# Patient Record
Sex: Male | Born: 1980 | Race: Black or African American | Hispanic: No | Marital: Single | Smoking: Current every day smoker
Health system: Southern US, Community
[De-identification: ages and names within clinical notes are randomized; demographics above are authoritative.]

## PROBLEM LIST (undated history)

## (undated) HISTORY — PX: EXPLORATORY LAPAROTOMY: SUR591

---

## 2010-03-02 ENCOUNTER — Emergency Department (HOSPITAL_BASED_OUTPATIENT_CLINIC_OR_DEPARTMENT_OTHER): Admission: EM | Admit: 2010-03-02 | Discharge: 2010-03-02 | Payer: Self-pay | Admitting: Emergency Medicine

## 2011-07-07 ENCOUNTER — Encounter: Payer: Self-pay | Admitting: *Deleted

## 2011-07-07 ENCOUNTER — Emergency Department (HOSPITAL_BASED_OUTPATIENT_CLINIC_OR_DEPARTMENT_OTHER)
Admission: EM | Admit: 2011-07-07 | Discharge: 2011-07-07 | Disposition: A | Discharge status: Home / Self Care | Payer: Self-pay | Attending: Emergency Medicine | Admitting: Emergency Medicine

## 2011-07-07 ENCOUNTER — Emergency Department (HOSPITAL_BASED_OUTPATIENT_CLINIC_OR_DEPARTMENT_OTHER): Payer: Self-pay

## 2011-07-07 ENCOUNTER — Emergency Department (INDEPENDENT_AMBULATORY_CARE_PROVIDER_SITE_OTHER): Payer: Self-pay

## 2011-07-07 DIAGNOSIS — S51809A Unspecified open wound of unspecified forearm, initial encounter: Secondary | ICD-10-CM

## 2011-07-07 DIAGNOSIS — S41119A Laceration without foreign body of unspecified upper arm, initial encounter: Secondary | ICD-10-CM

## 2011-07-07 DIAGNOSIS — W269XXA Contact with unspecified sharp object(s), initial encounter: Secondary | ICD-10-CM

## 2011-07-07 DIAGNOSIS — F17200 Nicotine dependence, unspecified, uncomplicated: Secondary | ICD-10-CM | POA: Insufficient documentation

## 2011-07-07 DIAGNOSIS — W268XXA Contact with other sharp object(s), not elsewhere classified, initial encounter: Secondary | ICD-10-CM | POA: Insufficient documentation

## 2011-07-07 MED ORDER — LIDOCAINE-EPINEPHRINE 2 %-1:100000 IJ SOLN
INTRAMUSCULAR | Status: AC
  Administered 2011-07-07: 15:00:00
  Filled 2011-07-07: qty 1

## 2011-07-07 MED ORDER — TETANUS-DIPHTHERIA TOXOIDS TD 5-2 LFU IM INJ: 0.5000 mL | INJECTION | Freq: Once | INTRAMUSCULAR | Status: DC

## 2011-07-07 MED ORDER — LIDOCAINE HCL (PF) 1 % IJ SOLN: 5.0000 mL | Freq: Once | INTRAMUSCULAR | Status: DC

## 2011-07-07 MED ORDER — TETANUS-DIPHTH-ACELL PERTUSSIS 5-2.5-18.5 LF-MCG/0.5 IM SUSP
0.5000 mL | Freq: Once | INTRAMUSCULAR | Status: AC
  Administered 2011-07-07: 0.5 mL via INTRAMUSCULAR
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
## 2011-07-20 ENCOUNTER — Encounter (HOSPITAL_BASED_OUTPATIENT_CLINIC_OR_DEPARTMENT_OTHER): Payer: Self-pay | Admitting: *Deleted

## 2011-07-20 ENCOUNTER — Emergency Department (HOSPITAL_BASED_OUTPATIENT_CLINIC_OR_DEPARTMENT_OTHER)
Admission: EM | Admit: 2011-07-20 | Discharge: 2011-07-20 | Disposition: A | Discharge status: Home / Self Care | Payer: Self-pay | Attending: Emergency Medicine | Admitting: Emergency Medicine

## 2011-07-20 DIAGNOSIS — F17200 Nicotine dependence, unspecified, uncomplicated: Secondary | ICD-10-CM | POA: Insufficient documentation

## 2011-07-20 DIAGNOSIS — Z4802 Encounter for removal of sutures: Secondary | ICD-10-CM | POA: Insufficient documentation

## 2011-07-20 NOTE — ED Provider Notes (Signed)
History     CSN: [id number] Arrival date & time: 07/20/2011 10:57 PM  Chief Complaint  Patient presents with  . Suture / Staple Removal   Patient is a 30 y.o. male presenting with suture removal. The history is provided by the patient.  Suture / Staple Removal  The sutures were placed 11 to 14 days ago. There has been no treatment since the wound repair. There has been no drainage from the wound. There is no redness present. There is no swelling present. The pain has no pain.    History reviewed. No pertinent past medical history.  Past Surgical History  Procedure Date  . Exploratory laparotomy     History reviewed. No pertinent family history.  History  Substance Use Topics  . Smoking status: Current Everyday Smoker -- 0.5 packs/day  . Smokeless tobacco: Not on file  . Alcohol Use: Yes      Review of Systems  All other systems reviewed and are negative.    Physical Exam  BP 125/70  Pulse 63  Temp(Src) 98.2 F (36.8 C) (Oral)  Resp 18  SpO2 96%  Physical Exam  Nursing note and vitals reviewed. Constitutional: He is oriented to person, place, and time. He appears well-developed and well-nourished.  Non-toxic appearance.  HENT:  Head: Normocephalic and atraumatic.  Eyes: Conjunctivae are normal. Pupils are equal, round, and reactive to light.  Neck: Normal range of motion.  Cardiovascular: Normal rate.   Pulmonary/Chest: Effort normal.  Musculoskeletal:       Right shoulder: He exhibits laceration. He exhibits no tenderness and no pain.       Arms: Neurological: He is alert and oriented to person, place, and time.  Skin: Skin is warm and dry.  Psychiatric: He has a normal mood and affect.    ED Course  Procedures  MDM   Laceration healed, nurse removed sutres      Toy Baker, MD 07/20/11 2334

## 2011-07-20 NOTE — ED Notes (Signed)
Pt has 4 sutures to right forearm.  2 have already started to separate and 2 are still intact with some swelling and pus noted to entry site.  Pt was not given abx at time of suture

## 2011-07-20 NOTE — ED Notes (Addendum)
Pt presents to ED today for suture removal from lac to right forearm.  Initial placement 07/07/11

## 2012-02-02 ENCOUNTER — Emergency Department (HOSPITAL_BASED_OUTPATIENT_CLINIC_OR_DEPARTMENT_OTHER)
Admission: EM | Admit: 2012-02-02 | Discharge: 2012-02-02 | Disposition: A | Discharge status: Home / Self Care | Payer: Self-pay | Attending: Emergency Medicine | Admitting: Emergency Medicine

## 2012-02-02 ENCOUNTER — Encounter (HOSPITAL_BASED_OUTPATIENT_CLINIC_OR_DEPARTMENT_OTHER): Payer: Self-pay | Admitting: *Deleted

## 2012-02-02 DIAGNOSIS — L089 Local infection of the skin and subcutaneous tissue, unspecified: Secondary | ICD-10-CM | POA: Insufficient documentation

## 2012-02-02 MED ORDER — HYDROCODONE-ACETAMINOPHEN 5-325 MG PO TABS: 2.0000 | ORAL_TABLET | ORAL | Status: AC | PRN

## 2012-02-02 MED ORDER — CEFTRIAXONE SODIUM 1 G IJ SOLR
1.0000 g | Freq: Once | INTRAMUSCULAR | Status: AC
  Administered 2012-02-02: 1 g via INTRAMUSCULAR
  Filled 2012-02-02: qty 10

## 2012-02-02 MED ORDER — TETANUS-DIPHTH-ACELL PERTUSSIS 5-2.5-18.5 LF-MCG/0.5 IM SUSP
0.5000 mL | Freq: Once | INTRAMUSCULAR | Status: AC
  Administered 2012-02-02: 0.5 mL via INTRAMUSCULAR
  Filled 2012-02-02: qty 0.5

## 2012-02-02 MED ORDER — CLINDAMYCIN HCL 150 MG PO CAPS: 150.0000 mg | ORAL_CAPSULE | Freq: Four times a day (QID) | ORAL | Status: AC
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
## 2012-02-02 NOTE — Discharge Instructions (Signed)
Skin Infections A skin infection usually develops as a result of disruption of the skin barrier.  CAUSES  A skin infection might occur following:  Trauma or an injury to the skin such as a cut or insect sting.   Inflammation (as in eczema).   Breaks in the skin between the toes (as in athlete's foot).   Swelling (edema).  SYMPTOMS  The legs are the most common site affected. Usually there is:  Redness.   Swelling.   Pain.   There may be red streaks in the area of the infection.  TREATMENT   Minor skin infections may be treated with topical antibiotics, but if the skin infection is severe, hospital care and intravenous (IV) antibiotic treatment may be needed.   Most often skin infections can be treated with oral antibiotic medicine as well as proper rest and elevation of the affected area until the infection improves.   If you are prescribed oral antibiotics, it is important to take them as directed and to take all the pills even if you feel better before you have finished all of the medicine.   You may apply warm compresses to the area for 20-30 minutes 4 times daily.  You might need a tetanus shot now if:  You have no idea when you had the last one.   You have never had a tetanus shot before.   Your wound had dirt in it.  If you need a tetanus shot and you decide not to get one, there is a rare chance of getting tetanus. Sickness from tetanus can be serious. If you get a tetanus shot, your arm may swell and become red and warm at the shot site. This is common and not a problem. SEEK MEDICAL CARE IF:  The pain and swelling from your infection do not improve within 2 days.  SEEK IMMEDIATE MEDICAL CARE IF:  You develop a fever, chills, or other serious problems.  Document Released: 12/28/2004 Document Revised: 08/02/2011 Document Reviewed: 11/09/2008 [employer] Patient Information 2012 [employer]. 
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
## 2012-02-02 NOTE — ED Provider Notes (Signed)
Medical screening examination/treatment/procedure(s) were performed by non-physician practitioner and as supervising physician I was immediately available for consultation/collaboration.   Cyndra Numbers, MD 02/02/12 [phone]
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
## 2012-07-04 ENCOUNTER — Emergency Department (HOSPITAL_BASED_OUTPATIENT_CLINIC_OR_DEPARTMENT_OTHER)
Admission: EM | Admit: 2012-07-04 | Discharge: 2012-07-04 | Disposition: A | Discharge status: Home / Self Care | Payer: Self-pay | Attending: Emergency Medicine | Admitting: Emergency Medicine

## 2012-07-04 DIAGNOSIS — B354 Tinea corporis: Secondary | ICD-10-CM | POA: Insufficient documentation

## 2012-07-04 DIAGNOSIS — F17200 Nicotine dependence, unspecified, uncomplicated: Secondary | ICD-10-CM | POA: Insufficient documentation

## 2012-07-04 DIAGNOSIS — Z880 Allergy status to penicillin: Secondary | ICD-10-CM | POA: Insufficient documentation

## 2012-07-04 MED ORDER — KETOCONAZOLE 200 MG PO TABS: 200.0000 mg | ORAL_TABLET | Freq: Every day | ORAL | Status: DC

## 2012-07-04 MED ORDER — FLUCONAZOLE 200 MG PO TABS: ORAL_TABLET | ORAL | Status: DC

## 2012-07-04 NOTE — ED Provider Notes (Signed)
Medical screening examination/treatment/procedure(s) were performed by non-physician practitioner and as supervising physician I was immediately available for consultation/collaboration.   Glynn Octave, MD 07/04/12 [phone]

## 2012-07-04 NOTE — ED Notes (Signed)
Pt. Reports he takes all his clothes to dry cleaners except under clothes so he hasnt used any new detergent.  Pt. Reports he always switches up soap to bathe with.  Pt. Has noted spot on his forehead and on his head.

## 2012-07-04 NOTE — ED Provider Notes (Signed)
History     CSN: [id number]  Arrival date & time 07/04/12  1205   First MD Initiated Contact with Patient 07/04/12 1224      Chief Complaint  Patient presents with  . Rash    Pt. noticed 3-4 wks ago he started seeing round spots on arms and back raised with no drainage and no itching per Pt.    (Consider location/radiation/quality/duration/timing/severity/associated sxs/prior treatment) Patient is a 31 y.o. male presenting with rash. The history is provided by the patient. No language interpreter was used.  Rash  This is a new problem. The problem is associated with nothing. There has been no fever. The rash is present on the scalp, back and torso. The pain is at a severity of 0/10. The patient is experiencing no pain. The pain has been constant since onset. He has tried nothing for the symptoms.  Pt has large bald spot, lesion on forehead anarms  No past medical history on file.  Past Surgical History  Procedure Date  . Exploratory laparotomy     No family history on file.  History  Substance Use Topics  . Smoking status: Current Everyday Smoker -- 0.5 packs/day  . Smokeless tobacco: Not on file  . Alcohol Use: Yes      Review of Systems  Skin: Positive for rash.  All other systems reviewed and are negative.    Allergies  Amoxicillin  Home Medications   Current Outpatient Rx  Name Route Sig Dispense Refill  . KETOCONAZOLE 200 MG PO TABS Oral Take 1 tablet (200 mg total) by mouth daily. 28 tablet 0    BP 137/96  Pulse 71  Temp 98 F (36.7 C) (Oral)  Resp 16  Ht 6' (1.829 m)  Wt 250 lb (113.399 kg)  BMI 33.91 kg/m2  SpO2 100%  Physical Exam  Nursing note and vitals reviewed. Constitutional: He is oriented to person, place, and time. He appears well-developed and well-nourished.  HENT:  Head: Normocephalic and atraumatic.  Right Ear: External ear normal.  Left Ear: External ear normal.  Nose: Nose normal.  Mouth/Throat: Oropharynx is clear and  moist.  Eyes: Conjunctivae and EOM are normal. Pupils are equal, round, and reactive to light.  Cardiovascular: Normal rate.   Pulmonary/Chest: Effort normal.  Musculoskeletal: Normal range of motion.  Neurological: He is alert and oriented to person, place, and time. He has normal reflexes.  Skin: Rash noted.       Multiple lesions with skin clearing in center  Psychiatric: He has a normal mood and affect.    ED Course  Procedures (including critical care time)  Labs Reviewed - No data to display No results found.   1. Tinea corporis       MDM  Pt given diflucan x 2 weeks         Lonia Skinner [place], [place] 07/04/12 1259

## 2012-08-14 ENCOUNTER — Emergency Department (HOSPITAL_BASED_OUTPATIENT_CLINIC_OR_DEPARTMENT_OTHER)
Admission: EM | Admit: 2012-08-14 | Discharge: 2012-08-14 | Disposition: A | Discharge status: Home / Self Care | Payer: Self-pay | Attending: Emergency Medicine | Admitting: Emergency Medicine

## 2012-08-14 ENCOUNTER — Encounter (HOSPITAL_BASED_OUTPATIENT_CLINIC_OR_DEPARTMENT_OTHER): Payer: Self-pay | Admitting: *Deleted

## 2012-08-14 DIAGNOSIS — B350 Tinea barbae and tinea capitis: Secondary | ICD-10-CM | POA: Insufficient documentation

## 2012-08-14 DIAGNOSIS — B354 Tinea corporis: Secondary | ICD-10-CM | POA: Insufficient documentation

## 2012-08-14 DIAGNOSIS — N342 Other urethritis: Secondary | ICD-10-CM | POA: Insufficient documentation

## 2012-08-14 MED ORDER — LIDOCAINE HCL (PF) 1 % IJ SOLN
INTRAMUSCULAR | Status: AC
  Administered 2012-08-14: 2 mL
  Filled 2012-08-14: qty 5

## 2012-08-14 MED ORDER — CEFTRIAXONE SODIUM 250 MG IJ SOLR
250.0000 mg | Freq: Once | INTRAMUSCULAR | Status: AC
  Administered 2012-08-14: 250 mg via INTRAMUSCULAR
  Filled 2012-08-14: qty 250

## 2012-08-14 MED ORDER — KETOCONAZOLE 200 MG PO TABS: 200.0000 mg | ORAL_TABLET | Freq: Every day | ORAL | Status: AC

## 2012-08-14 MED ORDER — AZITHROMYCIN 250 MG PO TABS
1000.0000 mg | ORAL_TABLET | Freq: Once | ORAL | Status: AC
  Administered 2012-08-14: 1000 mg via ORAL
  Filled 2012-08-14: qty 4

## 2012-08-14 MED ORDER — KETOCONAZOLE 2 % EX CREA: TOPICAL_CREAM | Freq: Every day | CUTANEOUS | Status: AC

## 2012-08-14 NOTE — ED Notes (Signed)
Pt c/o ? Ring worm to left arm

## 2012-08-14 NOTE — ED Provider Notes (Signed)
History     CSN: [id number]  Arrival date & time 08/14/12  0008   First MD Initiated Contact with Patient 08/14/12 0153      Chief Complaint  Patient presents with  . Rash     HPI Patient presents to the emergency room with complaints of penile discharge.  He states it is a milky type discharge. He denies any fevers or coughing or abdominal pain.  Patient also mentions that he has had a persistent rash. He was seen previously and given a prescription for Diflucan but he did not complete the prescription because of the side effects. Patient states he continues to have a pulse on his scalp and is also noticed a rash on his body. He denies any fevers coughing weight loss or other complaints History reviewed. No pertinent past medical history.  Past Surgical History  Procedure Date  . Exploratory laparotomy     History reviewed. No pertinent family history.  History  Substance Use Topics  . Smoking status: Current Every Day Smoker -- 0.5 packs/day  . Smokeless tobacco: Not on file  . Alcohol Use: Yes      Review of Systems  All other systems reviewed and are negative.    Allergies  Amoxicillin  Home Medications   Current Outpatient Rx  Name Route Sig Dispense Refill  . FLUCONAZOLE 200 MG PO TABS  3 tablets once a day for 2 weeks for tinea 42 tablet 0    BP 125/86  Pulse 72  Temp 97.6 F (36.4 C) (Oral)  Resp 16  Ht 6\' 2"  (1.88 m)  Wt 250 lb (113.399 kg)  BMI 32.10 kg/m2  SpO2 100%  Physical Exam  Nursing note and vitals reviewed. Constitutional: He appears well-developed and well-nourished. No distress.  HENT:  Head: Normocephalic and atraumatic.  Right Ear: External ear normal.  Left Ear: External ear normal.  Eyes: Conjunctivae normal are normal. Right eye exhibits no discharge. Left eye exhibits no discharge. No scleral icterus.  Neck: Neck supple. No tracheal deviation present.  Cardiovascular: Normal rate.   Pulmonary/Chest: Effort normal. No  stridor. No respiratory distress.  Genitourinary: Penis normal.  Musculoskeletal: He exhibits no edema.  Neurological: He is alert. Cranial nerve deficit: no gross deficits.  Skin: Skin is warm and dry. No rash noted.       A few areas of scaling rash in small circular areas on his forearms, circular area on his scalp of alopecia  Psychiatric: He has a normal mood and affect.    ED Course  Procedures (including critical care time)   Labs Reviewed  GC/CHLAMYDIA PROBE AMP, GENITAL   No results found.   1. Urethritis   2. Tinea corporis   3. Tinea capitis       MDM  Patient treated in. They for STD exposure. Regarding his rash, this may be tinea corporis. He does have a spot on his scalp however that could be consistent with tinea capitis. Do not feel there is any evidence to suggest an emergency medical condition. I recommended he follow up with her primary doctor or dermatologist.  Will rx ketoconazole to treat tinea capitis.        Celene Kras, MD 08/14/12 [phone]

## 2012-08-15 LAB — GC/CHLAMYDIA PROBE AMP, GENITAL: Chlamydia, DNA Probe: NEGATIVE

## 2012-08-15 NOTE — ED Notes (Signed)
Patient called wanting lab results. Verified ID x 2. Informed pt positive for Gonorrhea. Treated per protocol. Advised to notify partner(s) for testing and treatment.

## 2012-08-16 NOTE — ED Notes (Signed)
[facility] faxed 

## 2016-06-06 ENCOUNTER — Emergency Department (HOSPITAL_BASED_OUTPATIENT_CLINIC_OR_DEPARTMENT_OTHER)
Admission: EM | Admit: 2016-06-06 | Discharge: 2016-06-06 | Disposition: A | Discharge status: Home / Self Care | Payer: Self-pay | Attending: Emergency Medicine | Admitting: Emergency Medicine

## 2016-06-06 ENCOUNTER — Encounter (HOSPITAL_BASED_OUTPATIENT_CLINIC_OR_DEPARTMENT_OTHER): Payer: Self-pay | Admitting: *Deleted

## 2016-06-06 ENCOUNTER — Emergency Department (HOSPITAL_BASED_OUTPATIENT_CLINIC_OR_DEPARTMENT_OTHER): Payer: Self-pay

## 2016-06-06 DIAGNOSIS — W1802XA Striking against glass with subsequent fall, initial encounter: Secondary | ICD-10-CM | POA: Insufficient documentation

## 2016-06-06 DIAGNOSIS — Y929 Unspecified place or not applicable: Secondary | ICD-10-CM | POA: Insufficient documentation

## 2016-06-06 DIAGNOSIS — Y999 Unspecified external cause status: Secondary | ICD-10-CM | POA: Insufficient documentation

## 2016-06-06 DIAGNOSIS — F17200 Nicotine dependence, unspecified, uncomplicated: Secondary | ICD-10-CM | POA: Insufficient documentation

## 2016-06-06 DIAGNOSIS — S61511A Laceration without foreign body of right wrist, initial encounter: Secondary | ICD-10-CM | POA: Insufficient documentation

## 2016-06-06 DIAGNOSIS — Y939 Activity, unspecified: Secondary | ICD-10-CM | POA: Insufficient documentation

## 2016-06-06 MED ORDER — CEPHALEXIN 250 MG PO CAPS
500.0000 mg | ORAL_CAPSULE | Freq: Once | ORAL | Status: AC
Start: 2016-06-06 — End: 2016-06-06
  Administered 2016-06-06: 500 mg via ORAL
  Filled 2016-06-06: qty 2

## 2016-06-06 MED ORDER — BUPIVACAINE HCL (PF) 0.5 % IJ SOLN
10.0000 mL | Freq: Once | INTRAMUSCULAR | Status: AC
  Administered 2016-06-06: 10 mL
  Filled 2016-06-06: qty 10

## 2016-06-06 MED ORDER — TETANUS-DIPHTH-ACELL PERTUSSIS 5-2.5-18.5 LF-MCG/0.5 IM SUSP
0.5000 mL | Freq: Once | INTRAMUSCULAR | Status: AC
  Administered 2016-06-06: 0.5 mL via INTRAMUSCULAR
  Filled 2016-06-06: qty 0.5

## 2016-06-06 MED ORDER — BACITRACIN ZINC 500 UNIT/GM EX OINT
TOPICAL_OINTMENT | Freq: Two times a day (BID) | CUTANEOUS | Status: DC
  Administered 2016-06-06: 16:00:00 via TOPICAL

## 2016-06-06 MED ORDER — CEPHALEXIN 500 MG PO CAPS: 500.0000 mg | ORAL_CAPSULE | Freq: Four times a day (QID) | ORAL | Status: DC

## 2016-06-06 MED ORDER — OXYCODONE-ACETAMINOPHEN 5-325 MG PO TABS
1.0000 | ORAL_TABLET | Freq: Once | ORAL | Status: AC
Start: 2016-06-06 — End: 2016-06-06
  Administered 2016-06-06: 1 via ORAL
  Filled 2016-06-06: qty 1

## 2016-06-06 MED ORDER — NAPROXEN 500 MG PO TABS: 500.0000 mg | ORAL_TABLET | Freq: Two times a day (BID) | ORAL | Status: DC

## 2016-06-06 MED ORDER — LIDOCAINE-EPINEPHRINE (PF) 2 %-1:200000 IJ SOLN
10.0000 mL | Freq: Once | INTRAMUSCULAR | Status: AC
  Administered 2016-06-06: 10 mL
  Filled 2016-06-06: qty 10

## 2016-06-06 MED ORDER — OXYCODONE-ACETAMINOPHEN 5-325 MG PO TABS: 1.0000 | ORAL_TABLET | Freq: Four times a day (QID) | ORAL | Status: DC | PRN

## 2016-06-06 NOTE — ED Provider Notes (Signed)
CSN: [id number]     Arrival date & time 06/06/16  1355 History   First MD Initiated Contact with Patient 06/06/16 1407     Chief Complaint  Patient presents with  . fall through glass patio door      (Consider location/radiation/quality/duration/timing/severity/associated sxs/prior Treatment) HPI   Daryl Stevens is a 35 y.o. male, patient with no pertinent past medical history, presenting to the ED with Injuries from a fall that occurred early this morning. Patient states that he tripped and fell and put his right hand through a sliding glass door. Endorses severe pain to the right hand and wrist. Patient has not taken anything for his pain. Patient denies head trauma, LOC, dizziness, nausea/vomiting, neck/back pain, or any other complaints. Patient's tetanus is not up-to-date.    History reviewed. No pertinent past medical history. Past Surgical History  Procedure Laterality Date  . Exploratory laparotomy     No family history on file. Social History  Substance Use Topics  . Smoking status: Current Every Day Smoker -- 0.50 packs/day  . Smokeless tobacco: None  . Alcohol Use: Yes    Review of Systems  Eyes: Negative for visual disturbance.  Gastrointestinal: Negative for nausea and vomiting.  Musculoskeletal: Positive for arthralgias (Right hand and wrist).  Skin: Positive for wound.  Neurological: Negative for dizziness, weakness, light-headedness, numbness and headaches.  All other systems reviewed and are negative.     Allergies  Amoxicillin  Home Medications   Prior to Admission medications   Medication Sig Start Date End Date Taking? Authorizing Provider  cephALEXin (KEFLEX) 500 MG capsule Take 1 capsule (500 mg total) by mouth 4 (four) times daily. 06/06/16   Alfred Harrel C Oviya Ammar, PA-C  naproxen (NAPROSYN) 500 MG tablet Take 1 tablet (500 mg total) by mouth 2 (two) times daily. 06/06/16   Fredy Gladu C Elias Dennington, PA-C  oxyCODONE-acetaminophen (PERCOCET/ROXICET) 5-325 MG tablet Take 1-2  tablets by mouth every 6 (six) hours as needed for severe pain. 06/06/16   Braeden Kennan C Abdulahad Mederos, PA-C   BP 152/87 mmHg  Pulse 83  Temp(Src) 98.4 F (36.9 C) (Oral)  Resp 18  Ht 5\' 11"  (1.803 m)  Wt 99.791 kg  BMI 30.70 kg/m2  SpO2 100% Physical Exam  Constitutional: He is oriented to person, place, and time. He appears well-developed and well-nourished. No distress.  HENT:  Head: Normocephalic and atraumatic.  Mouth/Throat: Oropharynx is clear and moist.  Eyes: Conjunctivae and EOM are normal. Pupils are equal, round, and reactive to light.  Neck: Normal range of motion. Neck supple.  Cardiovascular: Normal rate, regular rhythm and intact distal pulses.   Distal circulation in the form of capillary refill is intact and less than 2 seconds.  Pulmonary/Chest: Effort normal. No respiratory distress.  Abdominal: Soft. There is no tenderness. There is no guarding.  Musculoskeletal: He exhibits tenderness. He exhibits no edema.  Tenderness to the palmar right hand and wrist. Full ROM in all extremities and spine. Specifically, flexion and extension of the fingers of the right hand are fully intact. Movement of the cardinal directions of the right wrist are also intact. Flexion of the right 5th digit intact against resistance. Tendon is noted to be intact proximal and distal to the injury. No signs of curled tendon proximal to the wound. No paraspinal tenderness.   Neurological: He is alert and oriented to person, place, and time. He has normal reflexes.  No sensory deficits in the right hand or wrist. Strength 5/5 in all extremities. No gait  disturbance. Coordination intact. Cranial nerves III-XII grossly intact.   Skin: Skin is warm and dry. He is not diaphoretic.  7 cm laceration noted to the palmar, ulnar right wrist. Multiple, more superficial lacerations to the palm of the right hand.  Psychiatric: He has a normal mood and affect. His behavior is normal.  Nursing note and vitals  reviewed.        ED Course  .Marland KitchenLaceration Repair Date/Time: 06/06/2016 2:23 PM Performed by: Anselm Pancoast Authorized by: Anselm Pancoast Consent: Verbal consent obtained. Risks and benefits: risks, benefits and alternatives were discussed Consent given by: patient Patient understanding: patient states understanding of the procedure being performed Patient consent: the patient's understanding of the procedure matches consent given Procedure consent: procedure consent matches procedure scheduled Patient identity confirmed: verbally with patient and arm band Body area: upper extremity Location details: right wrist Laceration length: 7 cm Foreign bodies: no foreign bodies Tendon involvement: none Nerve involvement: none Vascular damage: no Anesthesia: local infiltration Local anesthetic: lidocaine 2% with epinephrine and bupivacaine 0.5% without epinephrine Anesthetic total: 6 ml Patient sedated: no Preparation: Patient was prepped and draped in the usual sterile fashion. Irrigation solution: tap water Irrigation method: tap Amount of cleaning: extensive Debridement: minimal Degree of undermining: minimal Wound skin closure material used: 4-0 Vicryl Rapide. Number of sutures: 12 Technique: simple Approximation: close Approximation difficulty: complex Dressing: 4x4 sterile gauze and antibiotic ointment Patient tolerance: Patient tolerated the procedure well with no immediate complications Comments: Function of the hand and wrist was tested before and after the procedure. The full depth of the wound was visualized with no exposed tendon, vessels, or nerves. The choice of suture material was made based on the preferences of the hand surgeon on call, Dr. Janee Morn, with whom the patient will need to follow up. There were no deep sutures placed.         Imaging Review Dg Wrist Complete Right  06/06/2016  CLINICAL DATA:  Patient fell through glass door EXAM: RIGHT WRIST - COMPLETE  3+ VIEW COMPARISON:  None. FINDINGS: Frontal, oblique, lateral, and ulnar deviation scaphoid images were obtained. There is no demonstrable fracture or dislocation. The joint spaces appear normal. No erosive change. No radiopaque foreign body evident. IMPRESSION: No fracture or dislocation. No apparent arthropathy. No radiopaque foreign body. Electronically Signed   By: Bretta Bang III M.D.   On: 06/06/2016 14:38   Dg Hand Complete Right  06/06/2016  CLINICAL DATA:  All pain fell through glass lower EXAM: RIGHT HAND - COMPLETE 3+ VIEW COMPARISON:  None. FINDINGS: Frontal, oblique, lateral views were obtained. Several PIP and DIP joints are flexed. There is no acute fracture or dislocation. There is no appreciable joint space narrowing or erosion. No radiopaque foreign body is evident. IMPRESSION: No fracture or dislocation. No appreciable arthropathic change noted. No radiopaque foreign body. Electronically Signed   By: Bretta Bang III M.D.   On: 06/06/2016 14:36   I have personally reviewed and evaluated these images as part of my medical decision-making.   EKG Interpretation None      MDM   Final diagnoses:  Wrist laceration, right, initial encounter    Daryl Stevens presents with a wrist laceration following a fall into a glass door.  Findings and plan of care discussed with Lyndal Pulley, MD. Dr. Clydene Pugh personally evaluated and examined this patient.  No functional or neuro deficits on exam. The main laceration was repaired without immediate complication. The other lacerations are superficial and would  not benefit from suturing. Tetanus updated. Wound care discussed. Patient is aware he will not need to have the sutures removed. Patient to follow-up with the hand surgeon, Dr. Janee Mornhompson. Patient given prescriptions for Keflex, naproxen, and Percocet. Necessary precautions with taking Percocet were discussed with the patient, including to avoid driving and performing other dangerous  activities. Return precautions also discussed. Patient voiced understanding of all instructions, agrees to the plan, and is comfortable with discharge.  Filed Vitals:   06/06/16 1409 06/06/16 1613  BP: 152/87 130/89  Pulse: 83 62  Temp: 98.4 F (36.9 C)   TempSrc: Oral   Resp: 18   Height: 5\' 11"  (1.803 m)   Weight: 99.791 kg   SpO2: 100% 99%         Anselm PancoastShawn C Merlene Dante, PA-C 06/06/16 1619  Lyndal Pulleyaniel Knott, MD 06/06/16 1721

## 2016-06-06 NOTE — ED Notes (Signed)
Patient here with laceration and pain to right wrist and hand after falling through glass patio door this am 0200. No other injuries reported or noted. Alert and oriented.

## 2016-06-06 NOTE — Discharge Instructions (Signed)
You have been seen today for a wrist laceration. Your imaging showed no abnormalities.   1. Hand surgeon: Follow up with the hand surgeon, Dr. Janee Mornhompson, as soon as possible. Call the number provided and set up an appointment.  2. Wound Care: Be sure to keep the wound clean and dry. Showers are ok, but do not submerge the wound (i.e. No baths and no swimming). Do not scrub the wound or surrounding area. Refer to the attached instructions for detailed wound care.  3. Wrist Splint: The wrist splint is used to remind you to not move your wrist and let the area heal. It should not be placed tightly.  4. Antibiotic: Please take all of your antibiotics until finished!   You may develop abdominal discomfort or diarrhea from the antibiotic.  You may help offset this with probiotics which you can buy or get in yogurt. Do not eat or take the probiotics until 2 hours after your antibiotic.   Return to ED should the wound edges come apart or signs of infection arise.

## 2016-11-05 IMAGING — DX DG WRIST COMPLETE 3+V*R*
4 series · 4 of 4 positions shown · non-contrast
Comparison: None.

CLINICAL DATA: Patient fell through glass door

EXAM:
RIGHT WRIST - COMPLETE 3+ VIEW

[wrist pa]
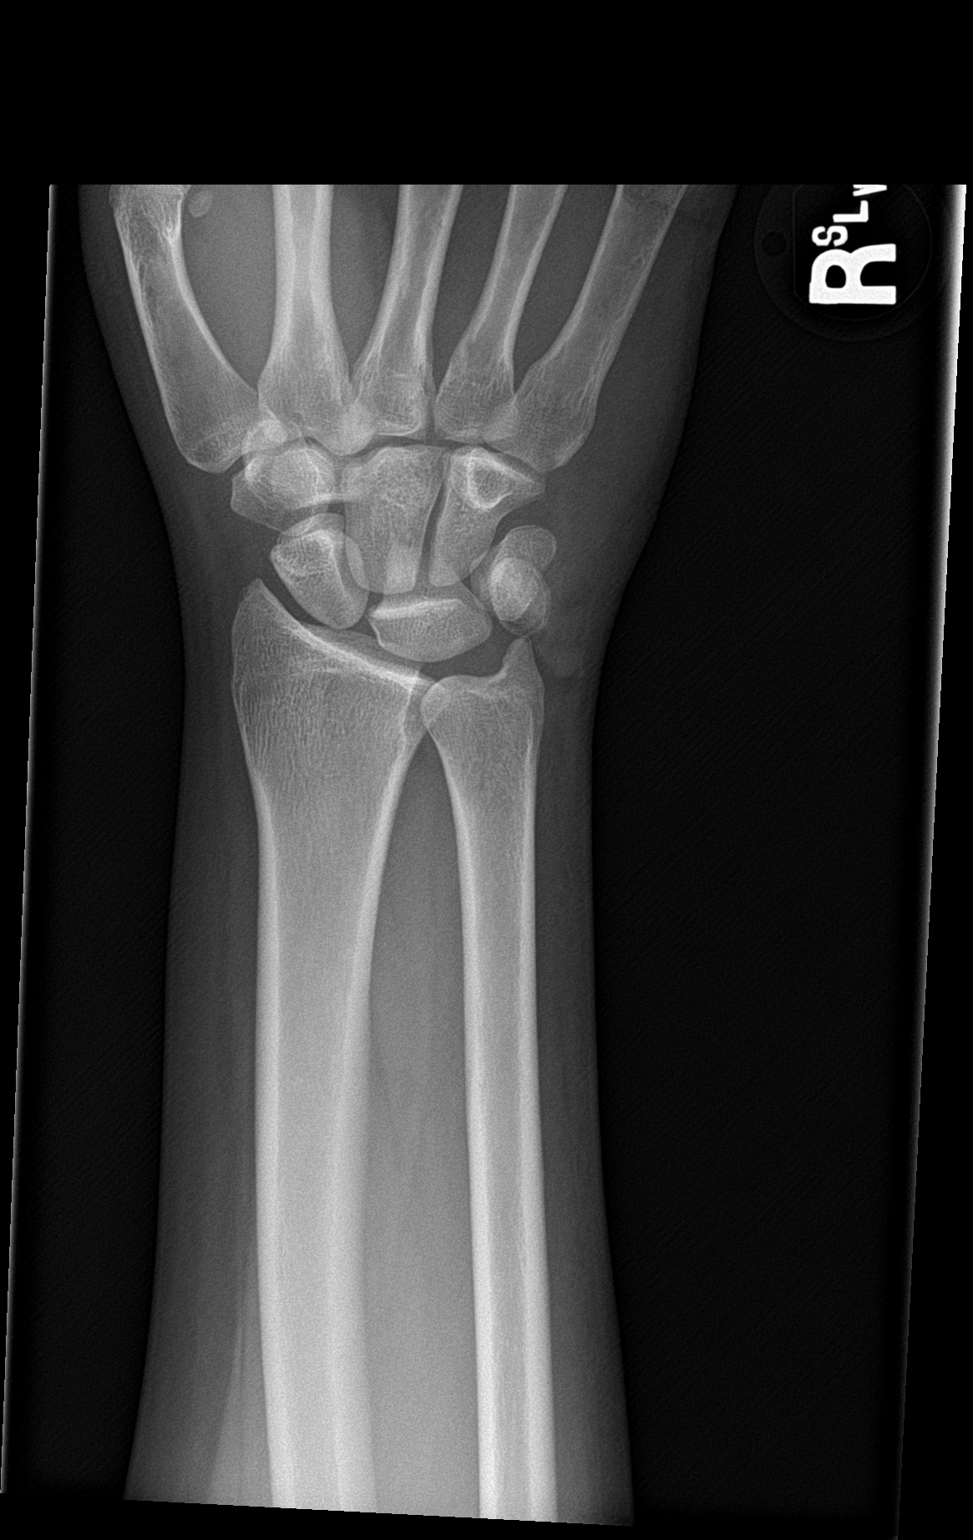

[wrist obl]
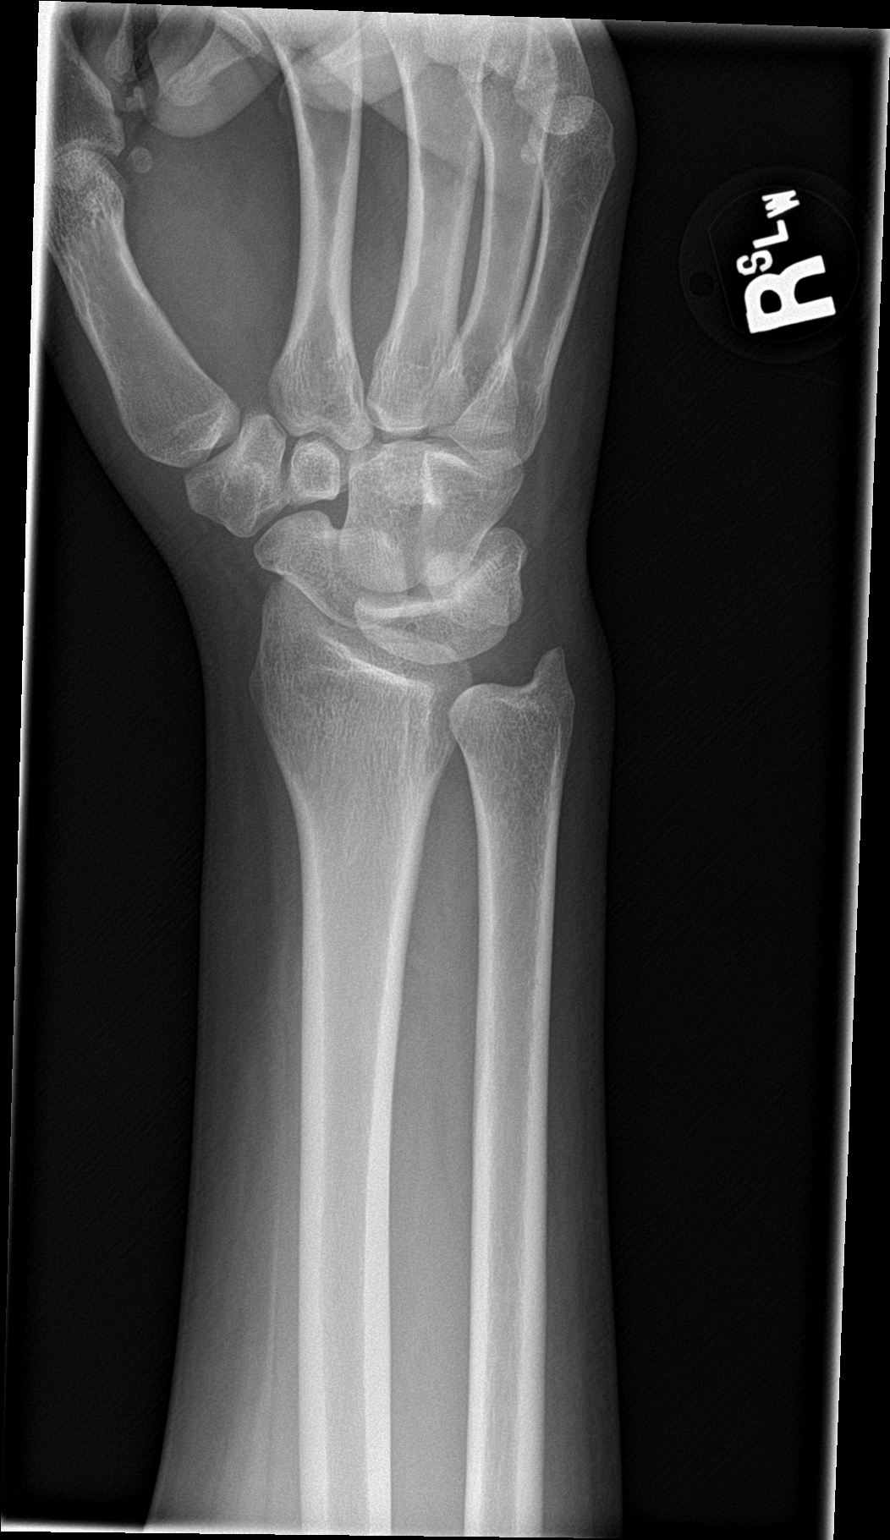

[wrist lat]
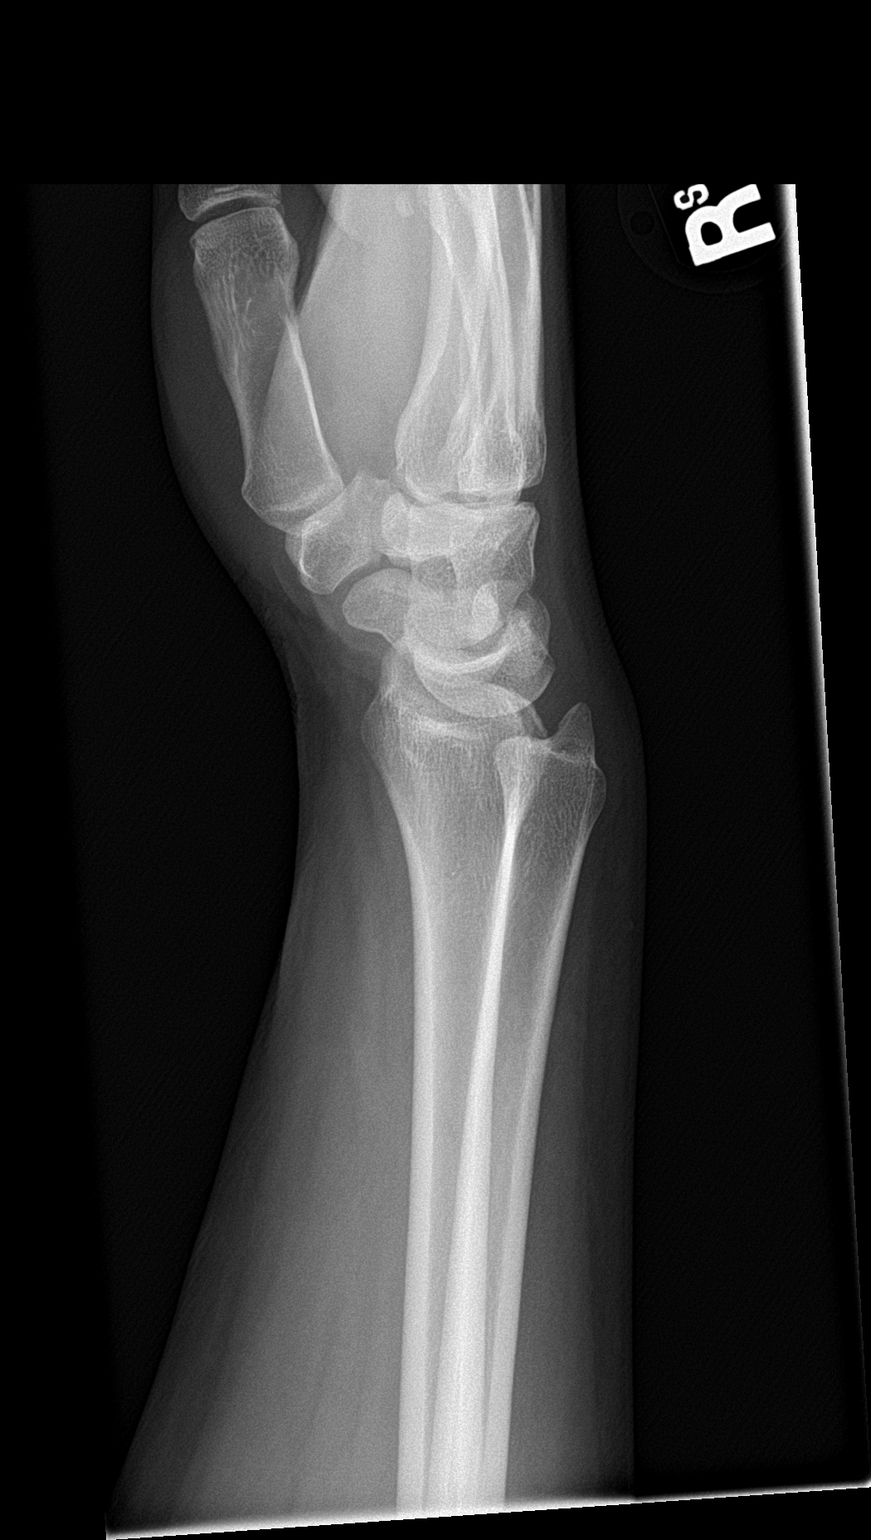

[wrist navicular]
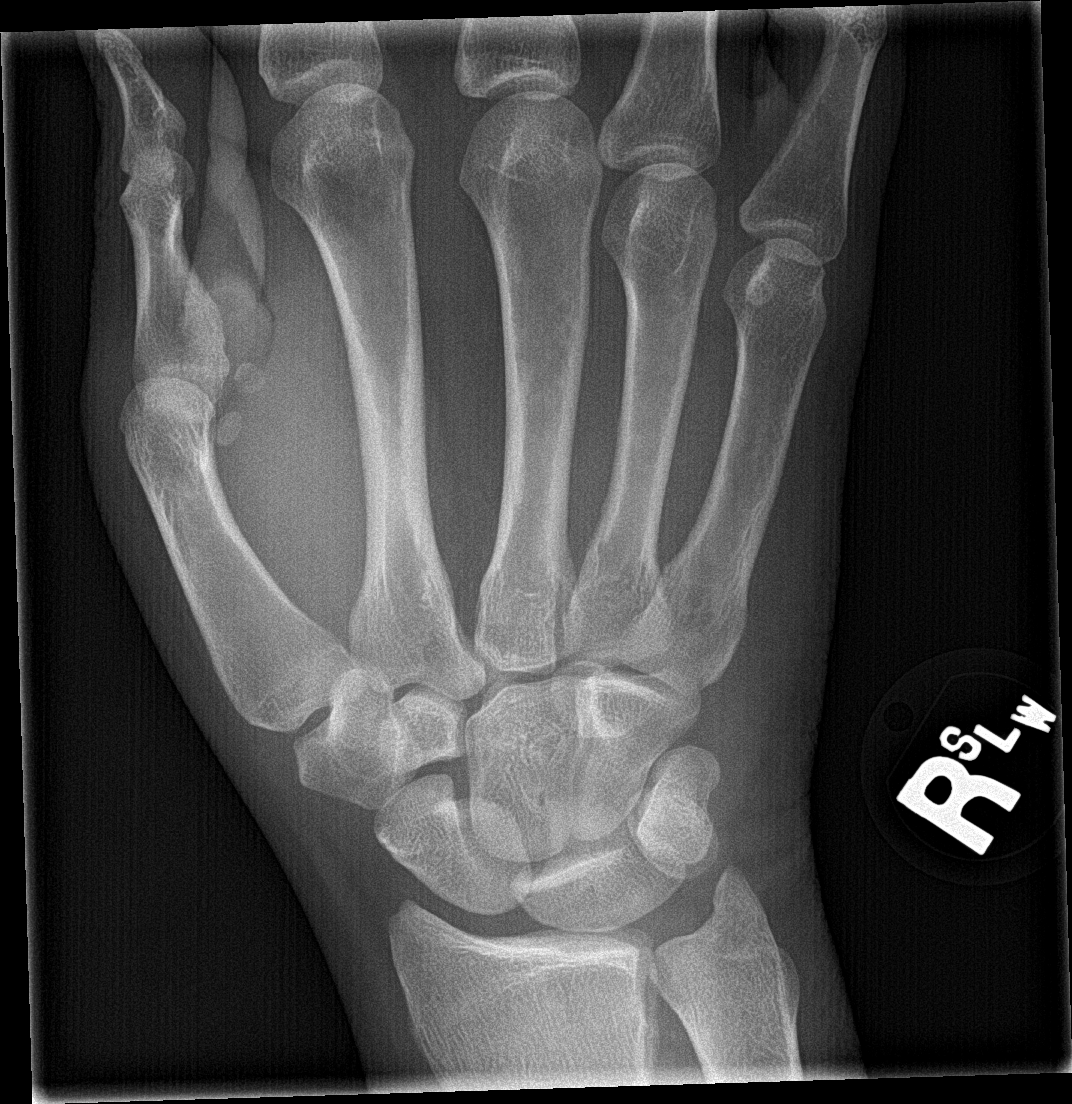

[4 of 4 positions shown; findings below may reference images not displayed]

FINDINGS: Frontal, oblique, lateral, and ulnar deviation scaphoid images were
obtained. There is no demonstrable fracture or dislocation. The
joint spaces appear normal. No erosive change. No radiopaque foreign
body evident.
IMPRESSION: No fracture or dislocation. No apparent arthropathy. No radiopaque
foreign body.

## 2016-12-10 ENCOUNTER — Encounter (HOSPITAL_BASED_OUTPATIENT_CLINIC_OR_DEPARTMENT_OTHER): Payer: Self-pay | Admitting: Emergency Medicine

## 2016-12-10 ENCOUNTER — Emergency Department (HOSPITAL_BASED_OUTPATIENT_CLINIC_OR_DEPARTMENT_OTHER)
Admission: EM | Admit: 2016-12-10 | Discharge: 2016-12-10 | Disposition: A | Discharge status: Home / Self Care | Payer: Self-pay | Attending: Emergency Medicine | Admitting: Emergency Medicine

## 2016-12-10 DIAGNOSIS — F17200 Nicotine dependence, unspecified, uncomplicated: Secondary | ICD-10-CM | POA: Insufficient documentation

## 2016-12-10 DIAGNOSIS — N342 Other urethritis: Secondary | ICD-10-CM | POA: Insufficient documentation

## 2016-12-10 DIAGNOSIS — N390 Urinary tract infection, site not specified: Secondary | ICD-10-CM

## 2016-12-10 LAB — URINALYSIS, ROUTINE W REFLEX MICROSCOPIC
BILIRUBIN URINE: NEGATIVE
GLUCOSE, UA: NEGATIVE mg/dL
Hgb urine dipstick: NEGATIVE
KETONES UR: NEGATIVE mg/dL
Nitrite: NEGATIVE
PH: 5.5 (ref 5.0–8.0)
Protein, ur: NEGATIVE mg/dL
SPECIFIC GRAVITY, URINE: 1.025 (ref 1.005–1.030)

## 2016-12-10 LAB — URINALYSIS, MICROSCOPIC (REFLEX)

## 2016-12-10 MED ORDER — CEFTRIAXONE SODIUM 250 MG IJ SOLR
250.0000 mg | Freq: Once | INTRAMUSCULAR | Status: AC
  Administered 2016-12-10: 250 mg via INTRAMUSCULAR
  Filled 2016-12-10: qty 250

## 2016-12-10 MED ORDER — LIDOCAINE HCL (PF) 2 % IJ SOLN
INTRAMUSCULAR | Status: AC
  Administered 2016-12-10: 0.9 mL
  Filled 2016-12-10: qty 2

## 2016-12-10 MED ORDER — DOXYCYCLINE HYCLATE 100 MG PO CAPS: 100.0000 mg | ORAL_CAPSULE | Freq: Two times a day (BID) | ORAL | 0 refills | Status: DC

## 2016-12-10 MED ORDER — AZITHROMYCIN 250 MG PO TABS
1000.0000 mg | ORAL_TABLET | Freq: Once | ORAL | Status: AC
  Administered 2016-12-10: 1000 mg via ORAL
  Filled 2016-12-10: qty 4

## 2016-12-10 NOTE — ED Provider Notes (Signed)
[facility]   CSN: [id number] Arrival date & time: 12/10/16  1201     History   Chief Complaint Chief Complaint  Patient presents with  . Dysuria    HPI Daryl Stevens is a 36 y.o. male.  HPI   36 year old male presents complaining of dysuria.  Pt report since yesterday he developed painful urination as well as Having penile discharge. He attributed to recent sexual interaction with his partner 2 days ago when his condom broke. He felt he may have contracted an STD from his partner. He has had the same sexual partners for the past 6 months. He does have history of gonorrhea in the past. He denies having fever, sore throat, abdominal pain, back pain, rectal pain, testicular pain or rash.  He does not want HIV or Syphilis testing, stating that he had it done recently and does not want further blood work.       History reviewed. No pertinent past medical history.  There are no active problems to display for this patient.   Past Surgical History:  Procedure Laterality Date  . EXPLORATORY LAPAROTOMY         Home Medications    Prior to Admission medications   Medication Sig Start Date End Date Taking? Authorizing Provider  cephALEXin (KEFLEX) 500 MG capsule Take 1 capsule (500 mg total) by mouth 4 (four) times daily. 06/06/16   Shawn C Joy, PA-C  naproxen (NAPROSYN) 500 MG tablet Take 1 tablet (500 mg total) by mouth 2 (two) times daily. 06/06/16   Shawn C Joy, PA-C  oxyCODONE-acetaminophen (PERCOCET/ROXICET) 5-325 MG tablet Take 1-2 tablets by mouth every 6 (six) hours as needed for severe pain. 06/06/16   Anselm Pancoast, PA-C    Family History No family history on file.  Social History Social History  Substance Use Topics  . Smoking status: Current Every Day Smoker    Packs/day: 0.50  . Smokeless tobacco: Never Used  . Alcohol use Yes     Allergies   Amoxicillin   Review of Systems Review of Systems  Constitutional: Negative for fever.    Gastrointestinal: Negative for abdominal pain.  Genitourinary: Positive for discharge and dysuria. Negative for genital sores, hematuria and testicular pain.  Musculoskeletal: Negative for back pain.     Physical Exam Updated Vital Signs BP 131/90 (BP Location: Left Arm)   Pulse 72   Temp 98.2 F (36.8 C) (Oral)   Resp 22   Ht 6' (1.829 m)   Wt 97.5 kg   SpO2 99%   BMI 29.16 kg/m   Physical Exam  Constitutional: He appears well-developed and well-nourished. No distress.  HENT:  Head: Atraumatic.  Eyes: Conjunctivae are normal.  Neck: Neck supple.  Cardiovascular: Normal rate and regular rhythm.   Pulmonary/Chest: Effort normal and breath sounds normal.  Abdominal: Soft. There is no tenderness.  Genitourinary:  Genitourinary Comments: Chaperone present during exam. Uncircumcised penis free of lesion or rash. No obvious penile discharge noted. No tenderness along penile shaft. No inguinal lymphadenopathy or inguinal hernia noted. Normal external genitalia. Testicles normal lie, nontender. Scrotal with normal appearance. Perineum is soft.  Neurological: He is alert.  Skin: No rash noted.  Psychiatric: He has a normal mood and affect.  Nursing note and vitals reviewed.    ED Treatments / Results  Labs (all labs ordered are listed, but only abnormal results are displayed) Labs Reviewed  URINALYSIS, ROUTINE W REFLEX MICROSCOPIC - Abnormal; Notable for the following:  Result Value   APPearance CLOUDY (*)    Leukocytes, UA MODERATE (*)    All other components within normal limits  URINALYSIS, MICROSCOPIC (REFLEX) - Abnormal; Notable for the following:    Bacteria, UA MANY (*)    Squamous Epithelial / LPF 0-5 (*)    All other components within normal limits    EKG  EKG Interpretation None       Radiology No results found.  Procedures Procedures (including critical care time)  Medications Ordered in ED Medications - No data to display   Initial  Impression / Assessment and Plan / ED Course  I have reviewed the triage vital signs and the nursing notes.  Pertinent labs & imaging results that were available during my care of the patient were reviewed by me and considered in my medical decision making (see chart for details).  Clinical Course     BP 131/90 (BP Location: Left Arm)   Pulse 72   Temp 98.2 F (36.8 C) (Oral)   Resp 22   Ht 6' (1.829 m)   Wt 97.5 kg   SpO2 99%   BMI 29.16 kg/m    Final Clinical Impressions(s) / ED Diagnoses   Final diagnoses:  Lower urinary tract infectious disease  Urethritis    New Prescriptions New Prescriptions   DOXYCYCLINE (VIBRAMYCIN) 100 MG CAPSULE    Take 1 capsule (100 mg total) by mouth 2 (two) times daily. One po bid x 7 days   2:54 PM Patient here with burning urination urinary frequency and urgency with penile discharge since yesterday. Urine shows signs of a urinary tract infection. Penile discharge suggestive of potential STD. Patient is amenable for prophylaxis treatment for gonorrhea and chlamydia with Rocephin and Zithromax. He will also be discharged with doxycycline for the next 7 days. GC and Chlamydia culture obtained. Patient refused blood work for HIV and syphilis test was not obtained.   Fayrene HelperBowie Ilena Dieckman, PA-C 12/10/16 1458    Lyndal Pulleyaniel Knott, MD 12/10/16 [phone]

## 2016-12-10 NOTE — ED Triage Notes (Signed)
Has had dysuria and discharge today

## 2016-12-10 NOTE — ED Notes (Signed)
ED Provider at bedside. 

## 2016-12-10 NOTE — ED Notes (Signed)
Pt refusing blood draw. EDP notified.

## 2016-12-11 LAB — GC/CHLAMYDIA PROBE AMP (~~LOC~~) NOT AT ARMC
Chlamydia: NEGATIVE
Neisseria Gonorrhea: POSITIVE — AB

## 2017-03-21 ENCOUNTER — Telehealth: Payer: Self-pay

## 2017-03-22 ENCOUNTER — Ambulatory Visit: Payer: Self-pay | Admitting: Family Medicine

## 2017-03-30 NOTE — Telephone Encounter (Signed)
Done

## 2017-06-19 ENCOUNTER — Encounter (HOSPITAL_COMMUNITY): Payer: Self-pay | Admitting: Family Medicine

## 2017-06-19 DIAGNOSIS — Y929 Unspecified place or not applicable: Secondary | ICD-10-CM | POA: Insufficient documentation

## 2017-06-19 DIAGNOSIS — W108XXA Fall (on) (from) other stairs and steps, initial encounter: Secondary | ICD-10-CM | POA: Insufficient documentation

## 2017-06-19 DIAGNOSIS — S0083XA Contusion of other part of head, initial encounter: Secondary | ICD-10-CM | POA: Insufficient documentation

## 2017-06-19 DIAGNOSIS — Z87891 Personal history of nicotine dependence: Secondary | ICD-10-CM | POA: Insufficient documentation

## 2017-06-19 DIAGNOSIS — Y9301 Activity, walking, marching and hiking: Secondary | ICD-10-CM | POA: Insufficient documentation

## 2017-06-19 DIAGNOSIS — Y999 Unspecified external cause status: Secondary | ICD-10-CM | POA: Insufficient documentation

## 2017-06-19 DIAGNOSIS — Z79899 Other long term (current) drug therapy: Secondary | ICD-10-CM | POA: Insufficient documentation

## 2017-06-19 NOTE — ED Triage Notes (Signed)
Patient reports he tripped today while coming down the steps and fell about 9 steps. Landed on his left side. Pt is complaining of left shoulder and left hip pain. Patient is ambulatory and able to move all extremities. Patient is concerned he will not be able to work tomorrow and would like to be out of work a couple of days.

## 2017-06-20 ENCOUNTER — Emergency Department (HOSPITAL_COMMUNITY)
Admission: EM | Admit: 2017-06-20 | Discharge: 2017-06-20 | Disposition: A | Discharge status: Home / Self Care | Payer: Self-pay | Attending: Emergency Medicine | Admitting: Emergency Medicine

## 2017-06-20 DIAGNOSIS — S0083XA Contusion of other part of head, initial encounter: Secondary | ICD-10-CM

## 2017-06-20 MED ORDER — IBUPROFEN 800 MG PO TABS
800.0000 mg | ORAL_TABLET | Freq: Once | ORAL | Status: DC
  Filled 2017-06-20: qty 1

## 2017-06-20 MED ORDER — IBUPROFEN 800 MG PO TABS
800.0000 mg | ORAL_TABLET | Freq: Three times a day (TID) | ORAL | 0 refills | Status: DC
Start: 2017-06-20 — End: 2024-01-28

## 2017-06-20 NOTE — Discharge Instructions (Signed)
Ice and Motrin disorder/painful areas.

## 2017-06-20 NOTE — ED Provider Notes (Addendum)
[facility]-EMERGENCY DEPT Provider Note   CSN: [id number] Arrival date & time: 06/19/17  2156  By signing my name below, I, Thelma Barge, attest that this documentation has been prepared under the direction and in the presence of Rolland Porter, MD. Electronically Signed: Thelma Barge, Scribe. 06/20/17. 12:19 AM. History   Chief Complaint Chief Complaint  Patient presents with  . Fall   The history is provided by the patient. No language interpreter was used.    HPI Comments: Daryl Stevens is a 36 y.o. male who presents to the Emergency Department complaining of constant, gradually worsening left-sided hip and shoulder pain s/p a fall that occurred earlier today. He states he was walking down the stairs and tripped over a box, fell forward, tumbled, and landed on his left side onto cement. 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 History reviewed. No pertinent past medical history.  There are no active problems to display for this patient.   Past Surgical History:  Procedure Laterality Date  . EXPLORATORY LAPAROTOMY         Home Medications    Prior to Admission medications   Medication Sig Start Date End Date Taking? Authorizing Provider  cephALEXin (KEFLEX) 500 MG capsule Take 1 capsule (500 mg total) by mouth 4 (four) times daily. 06/06/16   Joy, Shawn C, PA-C  doxycycline (VIBRAMYCIN) 100 MG capsule Take 1 capsule (100 mg total) by mouth 2 (two) times daily. One po bid x 7 days 12/10/16   Fayrene Helper, PA-C  ibuprofen (ADVIL,MOTRIN) 800 MG tablet Take 1 tablet (800 mg total) by mouth 3 (three) times daily. 06/20/17   Rolland Porter, MD  naproxen (NAPROSYN) 500 MG tablet Take 1 tablet (500 mg total) by mouth 2 (two) times daily. 06/06/16   Joy, Shawn C, PA-C  oxyCODONE-acetaminophen (PERCOCET/ROXICET) 5-325 MG tablet Take 1-2 tablets by mouth every 6 (six) hours as needed for severe pain. 06/06/16   Joy, Hillard Danker, PA-C    Family History History reviewed. No  pertinent family history.  Social History Social History  Substance Use Topics  . Smoking status: Former Smoker    Packs/day: 0.00  . Smokeless tobacco: Never Used     Comment: Reports he quit 2 months ago  . Alcohol use Yes     Comment: Once a week.      Allergies   Amoxicillin   Review of Systems Review of Systems  Constitutional: Negative for appetite change, chills, diaphoresis, fatigue and fever.  HENT: Negative for mouth sores, sore throat and trouble swallowing.   Eyes: Negative for visual disturbance.  Respiratory: Negative for cough, chest tightness, shortness of breath and wheezing.   Cardiovascular: Negative for chest pain.  Gastrointestinal: Negative for abdominal distention, abdominal pain, diarrhea, nausea and vomiting.  Endocrine: Negative for polydipsia, polyphagia and polyuria.  Genitourinary: Negative for dysuria, frequency and hematuria.  Musculoskeletal: Positive for arthralgias and myalgias. Negative for gait problem.  Skin: Negative for color change, pallor and rash.  Neurological: Negative for dizziness, syncope, light-headedness and headaches.  Hematological: Does not bruise/bleed easily.  Psychiatric/Behavioral: Negative for behavioral problems and confusion.  All other systems reviewed and are negative.    Physical Exam Updated Vital Signs BP 133/88 (BP Location: Left Arm)   Pulse 72   Temp 97.8 F (36.6 C) (Oral)   Resp 18   Ht 6' (1.829 m)   Wt 92.5 kg (203 lb 14.4 oz)   SpO2 98%   BMI 27.65  kg/m   Physical Exam  Constitutional: He is oriented to person, place, and time. He appears well-developed and well-nourished. No distress.  HENT:  Head: Normocephalic.  Eyes: Pupils are equal, round, and reactive to light. Conjunctivae are normal. No scleral icterus.  Neck: Normal range of motion. Neck supple. No thyromegaly present.  Cardiovascular: Normal rate and regular rhythm.  Exam reveals no gallop and no friction rub.   No murmur  heard. Pulmonary/Chest: Effort normal and breath sounds normal. No respiratory distress. He has no wheezes. He has no rales.  Abdominal: Soft. Bowel sounds are normal. He exhibits no distension. There is no tenderness. There is no rebound.  Musculoskeletal: Normal range of motion. He exhibits tenderness.  Tenderness on left proximal thigh laterally, not specific to the hip joint. Non specific pain around shoulder with full ROM  Neurological: He is alert and oriented to person, place, and time.  Skin: Skin is warm and dry. No rash noted.  Psychiatric: He has a normal mood and affect. His behavior is normal.     ED Treatments / Results  DIAGNOSTIC STUDIES: Oxygen Saturation is 98% on RA, normal by my interpretation.    COORDINATION OF CARE: 12:19 AM Discussed treatment plan with pt at bedside and pt agreed to plan.  Labs (all labs ordered are listed, but only abnormal results are displayed) Labs Reviewed - No data to display  EKG  EKG Interpretation None       Radiology No results found.  Procedures Procedures (including critical care time)  Medications Ordered in ED Medications  ibuprofen (ADVIL,MOTRIN) tablet 800 mg (not administered)     Initial Impression / Assessment and Plan / ED Course  I have reviewed the triage vital signs and the nursing notes.  Pertinent labs & imaging results that were available during my care of the patient were reviewed by me and considered in my medical decision making (see chart for details).      Final Clinical Impressions(s) / ED Diagnoses   Final diagnoses:  Contusion of face, initial encounter    New Prescriptions New Prescriptions   IBUPROFEN (ADVIL,MOTRIN) 800 MG TABLET    Take 1 tablet (800 mg total) by mouth 3 (three) times daily.  I personally performed the services described in this documentation, which was scribed in my presence. The recorded information has been reviewed and is accurate.     Rolland PorterJames, Makenze Ellett,  MD 06/20/17 Valentina [id number]    Rolland PorterJames, Harlym Gehling, MD 06/20/17 [phone]

## 2017-06-20 NOTE — ED Notes (Signed)
Patient refused ibuprofen and DC vital signs. Pt said "he had that medication at home and was fine".

## 2023-09-18 ENCOUNTER — Other Ambulatory Visit (HOSPITAL_BASED_OUTPATIENT_CLINIC_OR_DEPARTMENT_OTHER): Payer: Self-pay

## 2023-09-18 ENCOUNTER — Emergency Department (HOSPITAL_BASED_OUTPATIENT_CLINIC_OR_DEPARTMENT_OTHER)
Admission: EM | Admit: 2023-09-18 | Discharge: 2023-09-18 | Disposition: A | Discharge status: Home / Self Care | Payer: Self-pay | Attending: Emergency Medicine | Admitting: Emergency Medicine

## 2023-09-18 ENCOUNTER — Encounter (HOSPITAL_BASED_OUTPATIENT_CLINIC_OR_DEPARTMENT_OTHER): Payer: Self-pay | Admitting: Urology

## 2023-09-18 DIAGNOSIS — L0390 Cellulitis, unspecified: Secondary | ICD-10-CM

## 2023-09-18 DIAGNOSIS — L03115 Cellulitis of right lower limb: Secondary | ICD-10-CM | POA: Insufficient documentation

## 2023-09-18 DIAGNOSIS — W57XXXA Bitten or stung by nonvenomous insect and other nonvenomous arthropods, initial encounter: Secondary | ICD-10-CM | POA: Insufficient documentation

## 2023-09-18 MED ORDER — DOXYCYCLINE HYCLATE 100 MG PO CAPS
100.0000 mg | ORAL_CAPSULE | Freq: Two times a day (BID) | ORAL | 0 refills | Status: DC
  Filled 2023-09-18: qty 20, 10d supply, fill #0

## 2023-09-18 NOTE — Discharge Instructions (Addendum)
Take antibiotics as prescribed.  I recommend using Neosporin or bacitracin ointment twice daily for the next 7 days as well.  Please return if symptoms worsen.

## 2023-09-18 NOTE — ED Notes (Signed)
Called for in lobby with no answer.

## 2023-09-18 NOTE — ED Provider Notes (Signed)
[place] [facility] AT [facility] Provider Note   CSN: [id number] Arrival date & time: 09/18/23  1128     History  Chief Complaint  Patient presents with   Insect Bite    Daryl Stevens is a 42 y.o. male.  Patient here with insect bite to the right thigh.  He thinks maybe a spider bite.  Little redness and swelling over the last few days.  He used Neosporin once.  Nothing makes it worse or better.  No significant medical history.  Denies any fever or chills.  The history is provided by the patient.       Home Medications Prior to Admission medications   Medication Sig Start Date End Date Taking? Authorizing Provider  doxycycline (VIBRAMYCIN) 100 MG capsule Take 1 capsule (100 mg total) by mouth 2 (two) times daily. 09/18/23  Yes Gleason Ardoin, DO  cephALEXin (KEFLEX) 500 MG capsule Take 1 capsule (500 mg total) by mouth 4 (four) times daily. 06/06/16   Joy, Shawn C, PA-C  ibuprofen (ADVIL,MOTRIN) 800 MG tablet Take 1 tablet (800 mg total) by mouth 3 (three) times daily. 06/20/17   Rolland Porter, MD  naproxen (NAPROSYN) 500 MG tablet Take 1 tablet (500 mg total) by mouth 2 (two) times daily. 06/06/16   Joy, Shawn C, PA-C  oxyCODONE-acetaminophen (PERCOCET/ROXICET) 5-325 MG tablet Take 1-2 tablets by mouth every 6 (six) hours as needed for severe pain. 06/06/16   Joy, Shawn C, PA-C      Allergies    Amoxicillin    Review of Systems   Review of Systems  Physical Exam Updated Vital Signs BP 128/82 (BP Location: Left Arm)   Pulse 73   Temp 97.6 F (36.4 C) (Oral)   Resp 16   Ht 6' (1.829 m)   Wt 92.5 kg   SpO2 100%   BMI 27.66 kg/m  Physical Exam Constitutional:      General: He is not in acute distress.    Appearance: He is not ill-appearing.  HENT:     Head: Normocephalic.  Cardiovascular:     Pulses: Normal pulses.  Pulmonary:     Effort: Pulmonary effort is normal.  Skin:    Capillary Refill: Capillary refill takes less than 2 seconds.      Comments: Small area of skin breakdown in the right thigh with some redness and swelling around but no purulent drainage or major fluctuance  Neurological:     Mental Status: He is alert.     ED Results / Procedures / Treatments   Labs (all labs ordered are listed, but only abnormal results are displayed) Labs Reviewed - No data to display  EKG None  Radiology No results found.  Procedures Procedures    Medications Ordered in ED Medications - No data to display  ED Course/ Medical Decision Making/ A&P                                 Medical Decision Making Risk Prescription drug management.   Daryl Stevens is here with insect bite to his leg.  Small area that appears to be more cellulitic.  He has normal vitals.  Pain and swelling there for few days.  Skin breakdown in that area.  This appears consistent with a localized cellulitis.  I have no concern for abscess.  Will prescribe doxycycline.  Neurovascular neuromuscular intact.  Discharged in good condition.  Understands return precautions.  Recommend Neosporin or bacitracin ointment twice daily as well.  This chart was dictated using voice recognition software.  Despite best efforts to proofread,  errors can occur which can change the documentation meaning.         Final Clinical Impression(s) / ED Diagnoses Final diagnoses:  Cellulitis, unspecified cellulitis site    Rx / DC Orders ED Discharge Orders          Ordered    doxycycline (VIBRAMYCIN) 100 MG capsule  2 times daily        09/18/23 1221              Virgina Norfolk, DO 09/18/23 1224

## 2023-09-18 NOTE — ED Triage Notes (Addendum)
Pt states insect bite to right upper thigh that was notice last week  State painful to touch, slight redness noted

## 2024-01-28 ENCOUNTER — Emergency Department (HOSPITAL_BASED_OUTPATIENT_CLINIC_OR_DEPARTMENT_OTHER)
Admission: EM | Admit: 2024-01-28 | Discharge: 2024-01-28 | Disposition: A | Discharge status: Home / Self Care | Payer: Medicaid Other | Attending: Emergency Medicine | Admitting: Emergency Medicine

## 2024-01-28 ENCOUNTER — Other Ambulatory Visit: Payer: Self-pay

## 2024-01-28 ENCOUNTER — Encounter (HOSPITAL_BASED_OUTPATIENT_CLINIC_OR_DEPARTMENT_OTHER): Payer: Self-pay

## 2024-01-28 ENCOUNTER — Other Ambulatory Visit (HOSPITAL_BASED_OUTPATIENT_CLINIC_OR_DEPARTMENT_OTHER): Payer: Self-pay

## 2024-01-28 DIAGNOSIS — R059 Cough, unspecified: Secondary | ICD-10-CM | POA: Diagnosis present

## 2024-01-28 DIAGNOSIS — J069 Acute upper respiratory infection, unspecified: Secondary | ICD-10-CM

## 2024-01-28 DIAGNOSIS — Z20822 Contact with and (suspected) exposure to covid-19: Secondary | ICD-10-CM | POA: Insufficient documentation

## 2024-01-28 LAB — RESP PANEL BY RT-PCR (RSV, FLU A&B, COVID)  RVPGX2
Influenza A by PCR: NEGATIVE
Influenza B by PCR: NEGATIVE
Resp Syncytial Virus by PCR: NEGATIVE
SARS Coronavirus 2 by RT PCR: NEGATIVE

## 2024-01-28 MED ORDER — METOCLOPRAMIDE HCL 10 MG PO TABS
10.0000 mg | ORAL_TABLET | Freq: Once | ORAL | Status: AC
  Administered 2024-01-28: 10 mg via ORAL
  Filled 2024-01-28: qty 1

## 2024-01-28 MED ORDER — ONDANSETRON 4 MG PO TBDP
4.0000 mg | ORAL_TABLET | Freq: Three times a day (TID) | ORAL | 0 refills | Status: AC | PRN
  Filled 2024-01-28: qty 20, 7d supply, fill #0

## 2024-01-28 MED ORDER — BENZONATATE 100 MG PO CAPS
100.0000 mg | ORAL_CAPSULE | Freq: Three times a day (TID) | ORAL | 0 refills | Status: AC | PRN
  Filled 2024-01-28: qty 21, 7d supply, fill #0

## 2024-01-28 MED ORDER — ONDANSETRON 4 MG PO TBDP
8.0000 mg | ORAL_TABLET | Freq: Once | ORAL | Status: AC
  Administered 2024-01-28: 8 mg via ORAL
  Filled 2024-01-28: qty 2

## 2024-01-28 MED ORDER — DIPHENHYDRAMINE HCL 25 MG PO CAPS
25.0000 mg | ORAL_CAPSULE | Freq: Once | ORAL | Status: AC
  Administered 2024-01-28: 25 mg via ORAL
  Filled 2024-01-28: qty 1

## 2024-01-28 MED ORDER — IBUPROFEN 400 MG PO TABS
600.0000 mg | ORAL_TABLET | Freq: Once | ORAL | Status: AC
  Administered 2024-01-28: 600 mg via ORAL
  Filled 2024-01-28: qty 1

## 2024-01-28 MED ORDER — IBUPROFEN 600 MG PO TABS
600.0000 mg | ORAL_TABLET | Freq: Four times a day (QID) | ORAL | 0 refills | Status: AC | PRN
  Filled 2024-01-28: qty 30, 8d supply, fill #0

## 2024-01-28 MED ORDER — PROMETHAZINE HCL 25 MG PO TABS
25.0000 mg | ORAL_TABLET | Freq: Four times a day (QID) | ORAL | 0 refills | Status: AC | PRN
  Filled 2024-01-28: qty 30, 8d supply, fill #0

## 2024-01-28 NOTE — Discharge Instructions (Signed)
 As discussed, your workup today was overall reassuring.  You tested negative for COVID, flu, RSV.  Suspect your symptoms are likely secondary to viral process.  Will send you home with medicine to take as needed for fever, body aches.  Also send you home with a couple different medicines to use as needed for nausea called Zofran and Phenergan.  Recommend follow-up with your primary care for reassessment.  Please do not hesitate to return if the worrisome signs and symptoms we discussed become apparent.

## 2024-01-28 NOTE — ED Triage Notes (Signed)
 C/o generalized fatigue, chills, vomiting, no taste/smell, cough x 3 days. Recently exposed to covid.

## 2024-01-28 NOTE — ED Provider Notes (Signed)
 [place] [facility] Provider Note   CSN: [id number] Arrival date & time: 01/28/24  4098     History  Chief Complaint  Patient presents with   URI    Daryl Stevens is a 43 y.o. male.   URI   43 year old male presents emergency department with complaints of cough, generalized weakness, fatigue, loss of taste and smell.  Symptom onset for the past 3 days.  States that he is recently exposed to an individual with COVID and is concerned that he may have contracted the same.  Does report 1 episode of emesis 2 days ago with additional 1 this morning.  Denies any abdominal pain, chest pain, shortness of breath, urinary symptoms, change in bowel habits.  Has tried Tylenol for his body aches/headache which has helped.  Denies any visual streams, gait abnormality, slurred speech, facial droop, weakness/sensory deficits in upper or lower extremities ipsilaterally.  Denies any neck stiffness/rigidity.  Past medical history significant for ex lap  Home Medications Prior to Admission medications   Medication Sig Start Date End Date Taking? Authorizing Provider  benzonatate (TESSALON) 100 MG capsule Take 1 capsule (100 mg total) by mouth 3 (three) times daily as needed. 01/28/24  Yes Sherian Maroon A, PA  ibuprofen (ADVIL) 600 MG tablet Take 1 tablet (600 mg total) by mouth every 6 (six) hours as needed. 01/28/24  Yes Sherian Maroon A, PA  ondansetron (ZOFRAN-ODT) 4 MG disintegrating tablet Take 1 tablet (4 mg total) by mouth every 8 (eight) hours as needed. 01/28/24  Yes Peter Garter, PA  promethazine (PHENERGAN) 25 MG tablet Take 1 tablet (25 mg total) by mouth every 6 (six) hours as needed for nausea or vomiting. 01/28/24  Yes Peter Garter, PA      Allergies    Amoxicillin    Review of Systems   Review of Systems  All other systems reviewed and are negative.   Physical Exam Updated Vital Signs BP 136/85 (BP Location: Left Arm)   Pulse 81    Temp 97.6 F (36.4 C)   Resp 18   Ht 6' (1.829 m)   Wt 99.8 kg   SpO2 100%   BMI 29.84 kg/m  Physical Exam Vitals and nursing note reviewed.  Constitutional:      General: He is not in acute distress.    Appearance: He is well-developed.  HENT:     Head: Normocephalic and atraumatic.     Nose: Congestion present.     Mouth/Throat:     Comments: Mild posterior pharyngeal erythema.  Uvula midline right symmetric with phonation.  No sublingual extremity or swelling.  No trismus.  No change in phonation per patient.  Tonsils 1+ bilaterally without exudate. Eyes:     Conjunctiva/sclera: Conjunctivae normal.  Cardiovascular:     Rate and Rhythm: Normal rate and regular rhythm.     Heart sounds: No murmur heard. Pulmonary:     Effort: Pulmonary effort is normal. No respiratory distress.     Breath sounds: Normal breath sounds. No wheezing, rhonchi or rales.  Abdominal:     Palpations: Abdomen is soft.     Tenderness: There is no abdominal tenderness. There is no guarding.  Musculoskeletal:        General: No swelling.     Cervical back: Neck supple.  Skin:    General: Skin is warm and dry.     Capillary Refill: Capillary refill takes less than 2 seconds.  Neurological:  Mental Status: He is alert.     Comments: Alert and oriented to self, place, time and event.   Speech is fluent, clear without dysarthria or dysphasia.   Strength 5/5 in upper/lower extremities   Sensation intact in upper/lower extremities   Normal gait.  CN I not tested  CN II not tested CN III, IV, VI PERRLA and EOMs intact bilaterally  CN V Intact sensation to sharp and light touch to the face  CN VII facial movements symmetric  CN VIII not tested  CN IX, X no uvula deviation, symmetric rise of soft palate  CN XI 5/5 SCM and trapezius strength bilaterally  CN XII Midline tongue protrusion, symmetric L/R movements     Psychiatric:        Mood and Affect: Mood normal.     ED Results /  Procedures / Treatments   Labs (all labs ordered are listed, but only abnormal results are displayed) Labs Reviewed  RESP PANEL BY RT-PCR (RSV, FLU A&B, COVID)  RVPGX2    EKG None  Radiology No results found.  Procedures Procedures    Medications Ordered in ED Medications  ondansetron (ZOFRAN-ODT) disintegrating tablet 8 mg (8 mg Oral Given 01/28/24 0943)  ibuprofen (ADVIL) tablet 600 mg (600 mg Oral Given 01/28/24 0943)  metoCLOPramide (REGLAN) tablet 10 mg (10 mg Oral Given 01/28/24 0943)  diphenhydrAMINE (BENADRYL) capsule 25 mg (25 mg Oral Given 01/28/24 8295)    ED Course/ Medical Decision Making/ A&P                                 Medical Decision Making Risk Prescription drug management.   This patient presents to the ED for concern of cough, sore throat, body aches, this involves an extensive number of treatment options, and is a complaint that carries with it a high risk of complications and morbidity.  The differential diagnosis includes RSV, COVID, pneumonia, viral URI, bronchitis, asthma, COPD, malignancy, meningitis, other other     Co morbidities that complicate the patient evaluation   See HPI     Additional history obtained:   Additional history obtained from EMR External records from outside source obtained and reviewed including hospital records     Lab Tests:   I Ordered, and personally interpreted labs.  The pertinent results include: Viral testing negative     Imaging Studies ordered:   N/a     Cardiac Monitoring: / EKG:   The patient was maintained on a cardiac monitor.  I personally viewed and interpreted the cardiac monitored which showed an underlying rhythm of: Sinus rhythm     Consultations Obtained:   N/a     Problem List / ED Course / Critical interventions / Medication management   Viral URI with cough I ordered medication including Benadryl, Motrin, Reglan, Zofran Reevaluation of the patient after these medicines  showed that the patient improved I have reviewed the patients home medicines and have made adjustments as needed     Social Determinants of Health:   Denies tobacco, licit drug use.     Test / Admission - Considered:   Viral URI with cough Vitals signs significant for hypertension. Otherwise within normal range and stable throughout visit. Laboratory studies significant for: See above 43 year old male presents emergency department complaints of cough, generalized weakness, fatigue, loss of taste/smell for the past 3 days.  Has been trying over-the-counter medications with some improvement of symptoms.  On exam, lungs clear to auscultation bilaterally; low suspicion for pneumonia.  No abdominal tenderness.  No clinical evidence of meningismus.  Viral testing was negative.  Suspect that patient's likely secondary to upper respiratory viral illness.  Will recommend symptomatic therapy as described in AVS and close follow-up with PCP in the outpatient setting.  Treatment plan discussed at length with patient and he acknowledged understanding was agreeable to said plan.  Patient overall well-appearing, afebrile in no acute distress. Worrisome signs and symptoms were discussed with the patient, and the patient acknowledged understanding to return to the ED if noticed. Patient was stable upon discharge.         Final Clinical Impression(s) / ED Diagnoses Final diagnoses:  Viral URI with cough    Rx / DC Orders ED Discharge Orders     None         Peter Garter, [place] 01/28/24 1030    Arby Barrette, MD 02/04/24 1950

## 2024-02-07 ENCOUNTER — Other Ambulatory Visit (HOSPITAL_BASED_OUTPATIENT_CLINIC_OR_DEPARTMENT_OTHER): Payer: Self-pay

## 2024-02-25 ENCOUNTER — Other Ambulatory Visit: Payer: Self-pay

## 2024-02-25 ENCOUNTER — Emergency Department (HOSPITAL_BASED_OUTPATIENT_CLINIC_OR_DEPARTMENT_OTHER)
Admission: EM | Admit: 2024-02-25 | Discharge: 2024-02-25 | Disposition: A | Discharge status: Home / Self Care | Attending: Emergency Medicine | Admitting: Emergency Medicine

## 2024-02-25 ENCOUNTER — Encounter (HOSPITAL_BASED_OUTPATIENT_CLINIC_OR_DEPARTMENT_OTHER): Payer: Self-pay

## 2024-02-25 DIAGNOSIS — R197 Diarrhea, unspecified: Secondary | ICD-10-CM | POA: Insufficient documentation

## 2024-02-25 DIAGNOSIS — R112 Nausea with vomiting, unspecified: Secondary | ICD-10-CM | POA: Insufficient documentation

## 2024-02-25 LAB — RESP PANEL BY RT-PCR (RSV, FLU A&B, COVID)  RVPGX2
Influenza A by PCR: NEGATIVE
Influenza B by PCR: NEGATIVE
Resp Syncytial Virus by PCR: NEGATIVE
SARS Coronavirus 2 by RT PCR: NEGATIVE

## 2024-02-25 MED ORDER — ACETAMINOPHEN 500 MG PO TABS
1000.0000 mg | ORAL_TABLET | Freq: Once | ORAL | Status: AC
  Administered 2024-02-25: 1000 mg via ORAL
  Filled 2024-02-25: qty 2

## 2024-02-25 MED ORDER — ONDANSETRON 4 MG PO TBDP
8.0000 mg | ORAL_TABLET | Freq: Once | ORAL | Status: AC
  Administered 2024-02-25: 8 mg via ORAL
  Filled 2024-02-25: qty 2

## 2024-02-25 NOTE — ED Triage Notes (Signed)
 Pt to ED by pov from home with c/o flu-like symptoms for the past 4 days. Pt endorses chills, bodya aches and nvd. Arrives A+O, VSS, NADN.

## 2024-02-25 NOTE — ED Provider Notes (Signed)
 [place] [facility] Provider Note   CSN: [id number] Arrival date & time: 02/25/24  0417     History  Chief Complaint  Patient presents with   Influenza    Daryl Stevens is a 43 y.o. male.  The history is provided by the patient and the spouse.  Influenza Presenting symptoms: diarrhea, myalgias, nausea and vomiting   Presenting symptoms: no cough, no fever and no shortness of breath   Severity:  Moderate Onset quality:  Gradual Duration:  4 days Progression:  Unchanged Chronicity:  Recurrent Relieved by:  Nothing Worsened by:  Nothing Ineffective treatments:  None tried Associated symptoms: chills   Risk factors: not elderly        Home Medications Prior to Admission medications   Medication Sig Start Date End Date Taking? Authorizing Provider  benzonatate (TESSALON) 100 MG capsule Take 1 capsule (100 mg total) by mouth 3 (three) times daily as needed. 01/28/24   Peter Garter, PA  ibuprofen (ADVIL) 600 MG tablet Take 1 tablet (600 mg total) by mouth every 6 (six) hours as needed. 01/28/24   Sherian Maroon A, PA  ondansetron (ZOFRAN-ODT) 4 MG disintegrating tablet Take 1 tablet (4 mg total) by mouth every 8 (eight) hours as needed. 01/28/24   Peter Garter, PA  promethazine (PHENERGAN) 25 MG tablet Take 1 tablet (25 mg total) by mouth every 6 (six) hours as needed for nausea or vomiting. 01/28/24   Peter Garter, PA      Allergies    Amoxicillin    Review of Systems   Review of Systems  Constitutional:  Positive for chills. Negative for fever.  HENT:  Negative for hearing loss.   Respiratory:  Negative for cough and shortness of breath.   Cardiovascular:  Negative for chest pain.  Gastrointestinal:  Positive for diarrhea, nausea and vomiting.  Musculoskeletal:  Positive for myalgias.  All other systems reviewed and are negative.   Physical Exam Updated Vital Signs BP 129/86 (BP Location: Left Arm)   Pulse 69    Temp 97.7 F (36.5 C) (Oral)   Resp 16   SpO2 100%  Physical Exam Vitals and nursing note reviewed.  Constitutional:      General: He is not in acute distress.    Appearance: He is well-developed. He is not diaphoretic.  HENT:     Head: Normocephalic and atraumatic.     Nose: Nose normal.  Eyes:     Conjunctiva/sclera: Conjunctivae normal.     Pupils: Pupils are equal, round, and reactive to light.  Cardiovascular:     Rate and Rhythm: Normal rate and regular rhythm.     Pulses: Normal pulses.     Heart sounds: Normal heart sounds.  Pulmonary:     Effort: Pulmonary effort is normal.     Breath sounds: Normal breath sounds. No wheezing or rales.  Abdominal:     General: Bowel sounds are normal.     Palpations: Abdomen is soft.     Tenderness: There is no abdominal tenderness. There is no guarding or rebound.  Musculoskeletal:        General: Normal range of motion.     Cervical back: Normal range of motion and neck supple.  Skin:    General: Skin is warm and dry.     Capillary Refill: Capillary refill takes less than 2 seconds.  Neurological:     General: No focal deficit present.     Mental Status: He  is alert and oriented to person, place, and time.  Psychiatric:        Mood and Affect: Mood normal.        Behavior: Behavior normal.     ED Results / Procedures / Treatments   Labs (all labs ordered are listed, but only abnormal results are displayed) Labs Reviewed  RESP PANEL BY RT-PCR (RSV, FLU A&B, COVID)  RVPGX2    EKG None  Radiology No results found.  Procedures Procedures    Medications Ordered in ED Medications  acetaminophen (TYLENOL) tablet 1,000 mg (has no administration in time range)  ondansetron (ZOFRAN-ODT) disintegrating tablet 8 mg (8 mg Oral Given 02/25/24 7829)    ED Course/ Medical Decision Making/ A&P                                 Medical Decision Making Patient with chills, body aches nausea and vomiting and diarrhea.  Had  similar last month this is lasting longer   Amount and/or Complexity of Data Reviewed Independent Historian: spouse    Details: See above  External Data Reviewed: notes.    Details: Previous notes reviewed  Labs: ordered.    Details: Negative covid and flu   Risk OTC drugs. Prescription drug management. Risk Details: Well appearing.  Exam and vitals are benign and reassuring.  Symptoms consistent with viral illness. No clinical sins of dehydration.  RX for zofran, work note provided.  Stable for discharge.  Strict returns     Final Clinical Impression(s) / ED Diagnoses Final diagnoses:  Nausea vomiting and diarrhea   Return for intractable cough, coughing up blood, fevers > 100.4 unrelieved by medication, shortness of breath, intractable vomiting, chest pain, shortness of breath, weakness, numbness, changes in speech, facial asymmetry, abdominal pain, passing out, Inability to tolerate liquids or food, cough, altered mental status or any concerns. No signs of systemic illness or infection. The patient is nontoxic-appearing on exam and vital signs are within normal limits.  I have reviewed the triage vital signs and the nursing notes. Pertinent labs & imaging results that were available during my care of the patient were reviewed by me and considered in my medical decision making (see chart for details). After history, exam, and medical workup I feel the patient has been appropriately medically screened and is safe for discharge home. Pertinent diagnoses were discussed with the patient. Patient was given return precautions.    Rx / DC Orders ED Discharge Orders     None         Kyliah Deanda, MD 02/25/24 5621

## 2024-02-27 ENCOUNTER — Telehealth: Payer: Self-pay | Admitting: *Deleted

## 2024-02-27 NOTE — Telephone Encounter (Signed)
 Pt called regarding needing documentation for norovirus.   States that he was told by EDP that he had norovirus but it is not written on his paperwork on or in mychart so that he can turn in to his job.  States several people there are getting sick and job needed documentation of norovirs diagnosis.  RNCM left message for EDP.  Shakir Petrosino J. Lucretia Roers, RN, BSN, NCM  Transitions of Care  Nurse Case Manager  [facility] Emergency Departments  Operative Services  [phone]

## 2024-03-27 ENCOUNTER — Encounter (HOSPITAL_BASED_OUTPATIENT_CLINIC_OR_DEPARTMENT_OTHER): Payer: Self-pay

## 2024-03-27 ENCOUNTER — Other Ambulatory Visit (HOSPITAL_BASED_OUTPATIENT_CLINIC_OR_DEPARTMENT_OTHER): Payer: Self-pay

## 2024-03-27 ENCOUNTER — Other Ambulatory Visit: Payer: Self-pay

## 2024-03-27 ENCOUNTER — Emergency Department (HOSPITAL_BASED_OUTPATIENT_CLINIC_OR_DEPARTMENT_OTHER)
Admission: EM | Admit: 2024-03-27 | Discharge: 2024-03-27 | Disposition: A | Discharge status: Home / Self Care | Attending: Emergency Medicine | Admitting: Emergency Medicine

## 2024-03-27 DIAGNOSIS — R1310 Dysphagia, unspecified: Secondary | ICD-10-CM | POA: Insufficient documentation

## 2024-03-27 DIAGNOSIS — M542 Cervicalgia: Secondary | ICD-10-CM | POA: Insufficient documentation

## 2024-03-27 DIAGNOSIS — Z5329 Procedure and treatment not carried out because of patient's decision for other reasons: Secondary | ICD-10-CM | POA: Insufficient documentation

## 2024-03-27 DIAGNOSIS — J029 Acute pharyngitis, unspecified: Secondary | ICD-10-CM | POA: Diagnosis present

## 2024-03-27 LAB — RESP PANEL BY RT-PCR (RSV, FLU A&B, COVID)  RVPGX2
Influenza A by PCR: NEGATIVE
Influenza B by PCR: NEGATIVE
Resp Syncytial Virus by PCR: NEGATIVE
SARS Coronavirus 2 by RT PCR: NEGATIVE

## 2024-03-27 LAB — GROUP A STREP BY PCR: Group A Strep by PCR: NOT DETECTED

## 2024-03-27 MED ORDER — DEXAMETHASONE 6 MG PO TABS
12.0000 mg | ORAL_TABLET | Freq: Once | ORAL | 0 refills | Status: AC
  Filled 2024-03-27: qty 2, 1d supply, fill #0

## 2024-03-27 MED ORDER — CLINDAMYCIN HCL 150 MG PO CAPS
300.0000 mg | ORAL_CAPSULE | Freq: Three times a day (TID) | ORAL | 0 refills | Status: AC
  Filled 2024-03-27: qty 42, 7d supply, fill #0

## 2024-03-27 NOTE — ED Provider Notes (Signed)
  [facility] AT [facility] Provider Note   CSN: [id number] Arrival date & time: 03/27/24  4098     History  Chief Complaint  Patient presents with   Sore Throat    Daryl Stevens is a 42 y.o. male.  HPI     43 year old male who presents with concern for sore throat and neck pain.  Reports the symptoms he had 2 days ago.  Initially he thought he slept wrong with pain in his neck.  He reports significant pain when he tries to bring his chin down to his chest or look up.  He does not have as much pain looking side-to-side.  In addition to this, he is having pain with swallowing.  He denies any drooling or, dysphonia, or dyspnea.  He does describe odynophagia and dysphagia.  Denies fevers, history of IV drug use, history of abscesses or other infection.  He has not had nausea, vomiting, or other concerns.  History reviewed. No pertinent past medical history.   Home Medications Prior to Admission medications   Medication Sig Start Date End Date Taking? Authorizing Provider  clindamycin  (CLEOCIN ) 150 MG capsule Take 2 capsules (300 mg total) by mouth 3 (three) times daily for 7 days. 03/27/24 04/03/24 Yes Scarlette Currier, MD  dexamethasone  (DECADRON ) 6 MG tablet Take 2 tablets (12 mg total) by mouth once for 1 dose. 03/27/24 03/28/24 Yes Scarlette Currier, MD  benzonatate  (TESSALON ) 100 MG capsule Take 1 capsule (100 mg total) by mouth 3 (three) times daily as needed. 01/28/24   B[place]ee Butter, PA  ibuprofen  (ADVIL ) 600 MG tablet Take 1 tablet (600 mg total) by mouth every 6 (six) hours as needed. 01/28/24   Mercer Butter, PA  ondansetron  (ZOFRAN -ODT) 4 MG disintegrating tablet Take 1 tablet (4 mg total) by mouth every 8 (eight) hours as needed. 01/28/24   Phillips Butter, PA  promethazine  (PHENERGAN ) 25 MG tablet Take 1 tablet (25 mg total) by mouth every 6 (six) hours as needed for nausea or vomiting. 01/28/24   Hemet Butter, PA      Allergies     Amoxicillin    Review of Systems   Review of Systems  Physical Exam Updated Vital Signs BP (!) 143/98   Pulse 67   Temp 98.7 F (37.1 C) (Oral)   Resp 16   SpO2 100%  Physical Exam Vitals and nursing note reviewed.  Constitutional:      General: He is not in acute distress.    Appearance: He is well-developed. He is not diaphoretic.  HENT:     Head: Normocephalic and atraumatic.     Mouth/Throat:     Pharynx: Uvula midline. Pharyngeal swelling (swelling left posterior left pharynx) present. No oropharyngeal exudate.  Eyes:     Conjunctiva/sclera: Conjunctivae normal.  Neck:     Comments: Pain with neck flexion and extension Cardiovascular:     Rate and Rhythm: Normal rate and regular rhythm.     Heart sounds: Normal heart sounds. No murmur heard.    No friction rub. No gallop.  Pulmonary:     Effort: Pulmonary effort is normal. No respiratory distress.     Breath sounds: Normal breath sounds. No wheezing or rales.  Abdominal:     General: There is no distension.     Palpations: Abdomen is soft.     Tenderness: There is no abdominal tenderness. There is no guarding.  Skin:    General: Skin is warm and dry.  Neurological:     Mental Status: He is alert and oriented to person, place, and time.     ED Results / Procedures / Treatments   Labs (all labs ordered are listed, but only abnormal results are displayed) Labs Reviewed  GROUP A STREP BY PCR  RESP PANEL BY RT-PCR (RSV, FLU A&B, COVID)  RVPGX2    EKG None  Radiology No results found.  Procedures Procedures    Medications Ordered in ED Medications - No data to display  ED Course/ Medical Decision Making/ A&P                                  43 year old male who presents with concern for sore throat and neck pain.   His group A strep was negative, and COVID, influenza and RSV which were obtained to look for etiology of his sore throat in triage were also negative.  He does not have signs of  peritonsillar abscess on exam, no stridor, clinically have low suspicion for peritonsillar abscess, epiglottitis.  He does appear to have some posterior pharyngeal swelling, and with his significant neck pain with movements, I discussed that I am concerned for possible retropharyngeal abscess.  I had recommended further workup, however he is concerned regarding the cost.  Discussed a screening x-ray at the very least to evaluate for retropharyngeal fullness would be appropriate, but he declines.  Do not have concern regarding acute airway emergency at this time.  He has decided to leave AGAINST MEDICAL ADVICE without further evaluation due to his concerns for cost.  Discussed that he should come back to the emergency department if his symptoms worsen.  Will give him empiric antibiotics with clindamycin  and a dose of Decadron  to help with swelling and possibility of infection.         Final Clinical Impression(s) / ED Diagnoses Final diagnoses:  Sore throat  Neck pain    Rx / DC Orders ED Discharge Orders          Ordered    clindamycin  (CLEOCIN ) 150 MG capsule  3 times daily        03/27/24 0741    dexamethasone  (DECADRON ) 6 MG tablet   Once        03/27/24 0741              Scarlette Currier, MD 03/27/24 [phone]

## 2024-03-27 NOTE — ED Triage Notes (Signed)
 Pt states that he has sore throat and swollen lymph nodes x 2 days. Denies headache. Denies cough, congestion, and fever.
